# Patient Record
Sex: Female | Born: 2000 | Race: White | Hispanic: No | Marital: Single | Smoking: Never smoker
Health system: Southern US, Community
[De-identification: ages and names within clinical notes are randomized; demographics above are authoritative.]

---
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
## 2019-08-16 ENCOUNTER — Encounter: Payer: Self-pay | Admitting: Emergency Medicine

## 2019-08-16 ENCOUNTER — Other Ambulatory Visit: Payer: Self-pay

## 2019-08-16 ENCOUNTER — Emergency Department
Admission: EM | Admit: 2019-08-16 | Discharge: 2019-08-16 | Disposition: A | Discharge status: Home / Self Care | Payer: Medicaid Other | Attending: Student in an Organized Health Care Education/Training Program | Admitting: Student in an Organized Health Care Education/Training Program

## 2019-08-16 DIAGNOSIS — R112 Nausea with vomiting, unspecified: Secondary | ICD-10-CM | POA: Diagnosis present

## 2019-08-16 DIAGNOSIS — R1030 Lower abdominal pain, unspecified: Secondary | ICD-10-CM | POA: Diagnosis not present

## 2019-08-16 DIAGNOSIS — Z79899 Other long term (current) drug therapy: Secondary | ICD-10-CM | POA: Insufficient documentation

## 2019-08-16 LAB — URINALYSIS, COMPLETE (UACMP) WITH MICROSCOPIC
Bilirubin Urine: NEGATIVE
Glucose, UA: NEGATIVE mg/dL
Ketones, ur: 5 mg/dL — AB
Nitrite: NEGATIVE
Protein, ur: 30 mg/dL — AB
Specific Gravity, Urine: 1.031 — ABNORMAL HIGH (ref 1.005–1.030)
pH: 5 (ref 5.0–8.0)

## 2019-08-16 LAB — COMPREHENSIVE METABOLIC PANEL
ALT: 17 U/L (ref 0–44)
AST: 20 U/L (ref 15–41)
Albumin: 4.2 g/dL (ref 3.5–5.0)
Alkaline Phosphatase: 55 U/L (ref 38–126)
Anion gap: 11 (ref 5–15)
BUN: 10 mg/dL (ref 6–20)
CO2: 23 mmol/L (ref 22–32)
Calcium: 9.5 mg/dL (ref 8.9–10.3)
Chloride: 103 mmol/L (ref 98–111)
Creatinine, Ser: 0.77 mg/dL (ref 0.44–1.00)
GFR calc Af Amer: >60 mL/min (ref >60)
GFR calc non Af Amer: >60 mL/min (ref >60)
Glucose, Bld: 87 mg/dL (ref 70–99)
Potassium: 4 mmol/L (ref 3.5–5.1)
Sodium: 137 mmol/L (ref 135–145)
Total Bilirubin: 0.8 mg/dL (ref 0.3–1.2)
Total Protein: 8.1 g/dL (ref 6.5–8.1)

## 2019-08-16 LAB — POCT PREGNANCY, URINE: Preg Test, Ur: NEGATIVE

## 2019-08-16 LAB — LIPASE, BLOOD: Lipase: 24 U/L (ref 11–51)

## 2019-08-16 LAB — CBC
HCT: 41.4 % (ref 36.0–46.0)
Hemoglobin: 13.9 g/dL (ref 12.0–15.0)
MCH: 30.5 pg (ref 26.0–34.0)
MCHC: 33.6 g/dL (ref 30.0–36.0)
MCV: 91 fL (ref 80.0–100.0)
Platelets: 327 10*3/uL (ref 150–400)
RBC: 4.55 MIL/uL (ref 3.87–5.11)
RDW: 13.2 % (ref 11.5–15.5)
WBC: 8.6 10*3/uL (ref 4.0–10.5)
nRBC: 0 % (ref 0.0–0.2)

## 2019-08-16 MED ORDER — FLUCONAZOLE 150 MG PO TABS: 150.0000 mg | ORAL_TABLET | Freq: Every day | ORAL | 0 refills | Status: DC

## 2019-08-16 MED ORDER — ONDANSETRON 4 MG PO TBDP: 4.0000 mg | ORAL_TABLET | Freq: Three times a day (TID) | ORAL | 0 refills | Status: AC | PRN

## 2019-08-16 MED ORDER — ONDANSETRON 4 MG PO TBDP: 4.0000 mg | ORAL_TABLET | Freq: Once | ORAL | Status: DC | PRN

## 2019-08-16 MED ORDER — CEPHALEXIN 500 MG PO CAPS: 500.0000 mg | ORAL_CAPSULE | Freq: Two times a day (BID) | ORAL | 0 refills | Status: AC

## 2019-08-16 MED ORDER — CEPHALEXIN 500 MG PO CAPS
500.0000 mg | ORAL_CAPSULE | Freq: Once | ORAL | Status: AC
  Administered 2019-08-16: 500 mg via ORAL
  Filled 2019-08-16: qty 1

## 2019-08-16 MED ORDER — ONDANSETRON 4 MG PO TBDP
4.0000 mg | ORAL_TABLET | Freq: Once | ORAL | Status: AC
Start: 2019-08-16 — End: 2019-08-16
  Administered 2019-08-16: 19:00:00 4 mg via ORAL
  Filled 2019-08-16: qty 1

## 2019-08-16 NOTE — Discharge Instructions (Addendum)
Please follow up with primary care for symptoms that are not improving over the next few days.  Return to the ER for symptoms that change or worsen if unable to schedule an appointment.  

## 2019-08-16 NOTE — ED Triage Notes (Signed)
Pt arrived via POV with c/o lower abdominal pain, N/V x 3 days, unable to keep food or liquids down.   Pt describes the pain as constant and aching.

## 2019-08-16 NOTE — ED Notes (Signed)
See triage note- Pt here with N/V x 3days and constant abdominal pain. Pt reports missing period that was due Wednesday.

## 2019-08-16 NOTE — ED Provider Notes (Addendum)
[facility] Emergency Department Provider Note ____________________________________________   First MD Initiated Contact with Patient 08/16/19 1819     (approximate)  I have reviewed the triage vital signs and the nursing notes.   HISTORY  Chief Complaint Emesis and Abdominal Pain  HPI Cassandra Robertson is a 19 y.o. female who presents to the emergency department for treatment and evaluation of nausea and vomiting x 3 days.  Pain is constant and aching.  She has been able to drink water and sweet tea but has not been able to tolerate any food for the past 3 days.  She also states that she is a few days late on her menstrual cycle.  She denies any back pain.      History reviewed. No pertinent past medical history.  There are no problems to display for this patient.   History reviewed. No pertinent surgical history.  Prior to Admission medications   Medication Sig Start Date End Date Taking? Authorizing Provider  cephALEXin (KEFLEX) 500 MG capsule Take 1 capsule (500 mg total) by mouth 2 (two) times daily for 7 days. 08/16/19 08/23/19  Kem Boroughs B, FNP  norethindrone-ethinyl estradiol (JUNEL FE 1/20) 1-20 MG-MCG tablet Take 1 tablet by mouth daily. 06/05/18   Copland, Helmut Muster B, PA-C  ondansetron (ZOFRAN-ODT) 4 MG disintegrating tablet Take 1 tablet (4 mg total) by mouth every 8 (eight) hours as needed for nausea or vomiting. 08/16/19   Kem Boroughs B, FNP    Allergies Patient has no known allergies.  No family history on file.  Social History Social History   Tobacco Use  . Smoking status: Never Smoker  . Smokeless tobacco: Never Used  Substance Use Topics  . Alcohol use: No  . Drug use: No    Review of Systems  Constitutional: No fever/chills Eyes: No visual changes. ENT: No sore throat. Cardiovascular: Denies chest pain. Respiratory: Denies shortness of breath. Gastrointestinal: Positive for abdominal pain.  Positive for nausea and  vomiting.  No diarrhea.  No constipation. Genitourinary: Negative for dysuria. Musculoskeletal: Negative for back pain. Skin: Negative for rash. Neurological: Negative for headaches, focal weakness or numbness.  ____________________________________________   PHYSICAL EXAM:  VITAL SIGNS: ED Triage Vitals  Enc Vitals Group     BP 08/16/19 1604 (!) 141/93     Pulse Rate 08/16/19 1604 (!) 106     Resp 08/16/19 1604 18     Temp 08/16/19 1604 98.9 F (37.2 C)     Temp Source 08/16/19 1604 Oral     SpO2 08/16/19 1604 99 %     Weight 08/16/19 1606 140 lb (63.5 kg)     Height 08/16/19 1606 5' 2.5" (1.588 m)     Head Circumference --      Peak Flow --      Pain Score 08/16/19 1607 8     Pain Loc --      Pain Edu? --      Excl. in GC? --     Constitutional: Alert and oriented. Well appearing and in no acute distress. Eyes: Conjunctivae are normal. Head: Atraumatic. Nose: No congestion/rhinnorhea. Mouth/Throat: Mucous membranes are moist.  Oropharynx non-erythematous. Neck: No stridor.   Hematological/Lymphatic/Immunilogical: No cervical lymphadenopathy. Cardiovascular: Normal rate, regular rhythm. Grossly normal heart sounds.  Good peripheral circulation. Respiratory: Normal respiratory effort.  No retractions. Gastrointestinal: Soft with suprapubic tenderness. No distention. No abdominal bruits. No CVA tenderness. Genitourinary:  Musculoskeletal: No lower extremity tenderness nor edema.  No joint effusions.  Neurologic:  Normal speech and language. No gross focal neurologic deficits are appreciated. No gait instability. Skin:  Skin is warm, dry and intact. No rash noted. Psychiatric: Mood and affect are normal. Speech and behavior are normal.  ____________________________________________   LABS (all labs ordered are listed, but only abnormal results are displayed)  Labs Reviewed  URINALYSIS, COMPLETE (UACMP) WITH MICROSCOPIC - Abnormal; Notable for the following components:        Result Value   Color, Urine AMBER (*)    APPearance CLOUDY (*)    Specific Gravity, Urine 1.031 (*)    Hgb urine dipstick LARGE (*)    Ketones, ur 5 (*)    Protein, ur 30 (*)    Leukocytes,Ua TRACE (*)    Bacteria, UA RARE (*)    All other components within normal limits  LIPASE, BLOOD  COMPREHENSIVE METABOLIC PANEL  CBC  POCT PREGNANCY, URINE  POC URINE PREG, ED   ____________________________________________  EKG  Not indicated ____________________________________________  RADIOLOGY  ED MD interpretation:    Not indicated.  Official radiology report(s): No results found.  ____________________________________________   PROCEDURES  Procedure(s) performed (including Critical Care):  Procedures  ____________________________________________   INITIAL IMPRESSION / ASSESSMENT AND PLAN   19 year old female presents to the emergency department for treatment and evaluation of nausea and vomiting with abdominal pain for the past 3 days.  No diarrhea.  No fever.  She is able to drink water without vomiting, but food has made her nauseated for the past 3 days.  She has not attempted to eat anything in the past several hours.  No known sick exposures.  DIFFERENTIAL DIAGNOSIS  Onset of menses, pregnancy, gastroenteritis, COVID-19  ED COURSE  No vomiting after being given a Zofran while awaiting ER room assignment.  Protocol lab studies including urinalysis and POC pregnancy test were completed.  Pregnancy test is negative.  Labs are all reassuring.  Urinalysis does show concern for either onset of menses since she is 3 days late or acute cystitis.  Because she is somewhat symptomatic, she will be treated with 5 days of Keflex and advised to follow-up with her primary care provider for symptoms that are not improving over the next few days. Her first dose of Keflex given here since pharmacies are closed. No vomiting  reported. ____________________________________________   FINAL CLINICAL IMPRESSION(S) / ED DIAGNOSES  Final diagnoses:  Lower abdominal pain     ED Discharge Orders         Ordered    cephALEXin (KEFLEX) 500 MG capsule  2 times daily     08/16/19 1915    ondansetron (ZOFRAN-ODT) 4 MG disintegrating tablet  Every 8 hours PRN     08/16/19 1915           KEITH CANCIO was evaluated in Emergency Department on 08/16/2019 for the symptoms described in the history of present illness. She was evaluated in the context of the global COVID-19 pandemic, which necessitated consideration that the patient might be at risk for infection with the SARS-CoV-2 virus that causes COVID-19. Institutional protocols and algorithms that pertain to the evaluation of patients at risk for COVID-19 are in a state of rapid change based on information released by regulatory bodies including the CDC and federal and state organizations. These policies and algorithms were followed during the patient's care in the ED.   Note:  This document was prepared using Dragon voice recognition software and may include unintentional dictation errors.  Chinita Pester, FNP 08/16/19 1918    Chinita Pester, FNP 08/16/19 1919    Willy Eddy, MD 08/16/19 [phone]

## 2019-12-30 ENCOUNTER — Encounter: Payer: Self-pay | Admitting: Emergency Medicine

## 2019-12-30 ENCOUNTER — Emergency Department
Admission: EM | Admit: 2019-12-30 | Discharge: 2019-12-30 | Disposition: A | Discharge status: Home / Self Care | Payer: Medicaid Other | Attending: Emergency Medicine | Admitting: Emergency Medicine

## 2019-12-30 ENCOUNTER — Emergency Department: Payer: Medicaid Other

## 2019-12-30 DIAGNOSIS — N3090 Cystitis, unspecified without hematuria: Secondary | ICD-10-CM

## 2019-12-30 DIAGNOSIS — R319 Hematuria, unspecified: Secondary | ICD-10-CM | POA: Diagnosis present

## 2019-12-30 DIAGNOSIS — M791 Myalgia, unspecified site: Secondary | ICD-10-CM | POA: Diagnosis not present

## 2019-12-30 LAB — CBC
HCT: 38.7 % (ref 36.0–46.0)
Hemoglobin: 13.1 g/dL (ref 12.0–15.0)
MCH: 30.3 pg (ref 26.0–34.0)
MCHC: 33.9 g/dL (ref 30.0–36.0)
MCV: 89.6 fL (ref 80.0–100.0)
Platelets: 323 10*3/uL (ref 150–400)
RBC: 4.32 MIL/uL (ref 3.87–5.11)
RDW: 13.1 % (ref 11.5–15.5)
WBC: 11.8 10*3/uL — ABNORMAL HIGH (ref 4.0–10.5)
nRBC: 0 % (ref 0.0–0.2)

## 2019-12-30 LAB — COMPREHENSIVE METABOLIC PANEL
ALT: 19 U/L (ref 0–44)
AST: 19 U/L (ref 15–41)
Albumin: 3.9 g/dL (ref 3.5–5.0)
Alkaline Phosphatase: 65 U/L (ref 38–126)
Anion gap: 10 (ref 5–15)
BUN: 10 mg/dL (ref 6–20)
CO2: 24 mmol/L (ref 22–32)
Calcium: 8.9 mg/dL (ref 8.9–10.3)
Chloride: 103 mmol/L (ref 98–111)
Creatinine, Ser: 0.86 mg/dL (ref 0.44–1.00)
GFR calc Af Amer: >60 mL/min (ref >60)
GFR calc non Af Amer: >60 mL/min (ref >60)
Glucose, Bld: 93 mg/dL (ref 70–99)
Potassium: 3.6 mmol/L (ref 3.5–5.1)
Sodium: 137 mmol/L (ref 135–145)
Total Bilirubin: 0.5 mg/dL (ref 0.3–1.2)
Total Protein: 7.4 g/dL (ref 6.5–8.1)

## 2019-12-30 LAB — URINALYSIS, COMPLETE (UACMP) WITH MICROSCOPIC
Bacteria, UA: NONE SEEN
RBC / HPF: >50 RBC/hpf — ABNORMAL HIGH (ref 0–5)
Specific Gravity, Urine: 1.023 (ref 1.005–1.030)
Squamous Epithelial / HPF: NONE SEEN (ref 0–5)
WBC, UA: >50 WBC/hpf — ABNORMAL HIGH (ref 0–5)

## 2019-12-30 LAB — LIPASE, BLOOD: Lipase: 27 U/L (ref 11–51)

## 2019-12-30 LAB — POCT PREGNANCY, URINE: Preg Test, Ur: NEGATIVE

## 2019-12-30 MED ORDER — IOHEXOL 300 MG/ML  SOLN
75.0000 mL | Freq: Once | INTRAMUSCULAR | Status: AC | PRN
  Administered 2019-12-30: 100 mL via INTRAVENOUS

## 2019-12-30 MED ORDER — FLUCONAZOLE 150 MG PO TABS: 150.0000 mg | ORAL_TABLET | Freq: Every day | ORAL | 0 refills | Status: DC

## 2019-12-30 MED ORDER — SODIUM CHLORIDE 0.9% FLUSH: 3.0000 mL | Freq: Once | INTRAVENOUS | Status: DC

## 2019-12-30 MED ORDER — ACETAMINOPHEN 500 MG PO TABS
1000.0000 mg | ORAL_TABLET | Freq: Once | ORAL | Status: AC
  Administered 2019-12-30: 1000 mg via ORAL
  Filled 2019-12-30: qty 2

## 2019-12-30 MED ORDER — SODIUM CHLORIDE 0.9 % IV SOLN
1.0000 g | Freq: Once | INTRAVENOUS | Status: AC
  Administered 2019-12-30: 1 g via INTRAVENOUS
  Filled 2019-12-30: qty 10

## 2019-12-30 MED ORDER — CEPHALEXIN 500 MG PO CAPS: 500.0000 mg | ORAL_CAPSULE | Freq: Four times a day (QID) | ORAL | 0 refills | Status: AC

## 2019-12-30 NOTE — ED Notes (Signed)
Pt returned from CT via wheelchair.

## 2019-12-30 NOTE — ED Triage Notes (Signed)
Pt c/o urinary frequency, dysuria x3 days, hematuria starting this AM.

## 2019-12-30 NOTE — ED Provider Notes (Signed)
[facility] Emergency Department Provider Note  ____________________________________________   First MD Initiated Contact with Patient 12/30/19 [phone]     (approximate)  I have reviewed the triage vital signs and the nursing notes.   HISTORY  Chief Complaint Hematuria   HPI Cassandra Robertson is a 19 y.o. female with a past medical history of frequent recurrent urinary tract infections who presents for assessment of approximately 3 days of worsening dysuria, urinary frequency, suprapubic discomfort, and 1 day of gross hematuria.  Patient also endorses some left lower back pain.  She denies any fevers, chills, cough, shortness of breath, chest pain abdominal pain, nausea, vomiting, diarrhea, vaginal discharge, vaginal bleeding, rash, or other acute complaints.  States she took an over-the-counter antibiotic from [facility] 2 days ago but this has not helped.  She has never had bleeding with these episodes.  No clear alleviating or aggravating factors.  No prior similar episodes associate with bleeding.         History reviewed. No pertinent past medical history.  There are no problems to display for this patient.   History reviewed. No pertinent surgical history.  Prior to Admission medications   Medication Sig Start Date End Date Taking? Authorizing Provider  cephALEXin (KEFLEX) 500 MG capsule Take 1 capsule (500 mg total) by mouth 4 (four) times daily for 7 days. 12/30/19 01/06/20  Gilles Chiquito, MD  fluconazole (DIFLUCAN) 150 MG tablet Take 1 tablet (150 mg total) by mouth daily. 12/30/19   Gilles Chiquito, MD  norethindrone-ethinyl estradiol (JUNEL FE 1/20) 1-20 MG-MCG tablet Take 1 tablet by mouth daily. 06/05/18   Copland, Helmut Muster B, PA-C  ondansetron (ZOFRAN-ODT) 4 MG disintegrating tablet Take 1 tablet (4 mg total) by mouth every 8 (eight) hours as needed for nausea or vomiting. 08/16/19   Kem Boroughs B, FNP    Allergies Patient has no known  allergies.  No family history on file.  Social History Social History   Tobacco Use  . Smoking status: Never Smoker  . Smokeless tobacco: Never Used  Vaping Use  . Vaping Use: Former  Substance Use Topics  . Alcohol use: No  . Drug use: No    Review of Systems  Review of Systems  Constitutional: Negative for chills and fever.  HENT: Negative for sore throat.   Eyes: Negative for pain.  Respiratory: Negative for cough and stridor.   Cardiovascular: Negative for chest pain.  Gastrointestinal: Negative for vomiting.  Genitourinary: Positive for dysuria, frequency and hematuria.  Musculoskeletal: Positive for myalgias ( Left lower back ).  Skin: Negative for rash.  Neurological: Negative for seizures, loss of consciousness and headaches.  Psychiatric/Behavioral: Negative for suicidal ideas.  All other systems reviewed and are negative.     ____________________________________________   PHYSICAL EXAM:  VITAL SIGNS: ED Triage Vitals [12/30/19 0516]  Enc Vitals Group     BP 104/65     Pulse Rate 90     Resp 18     Temp 98.2 F (36.8 C)     Temp Source Oral     SpO2 98 %     Weight      Height      Head Circumference      Peak Flow      Pain Score      Pain Loc      Pain Edu?      Excl. in GC?    Vitals:   12/30/19 0516 12/30/19 0903  BP:  104/65 (!) 80/69  Pulse: 90 84  Resp: 18 16  Temp: 98.2 F (36.8 C)   SpO2: 98% 100%   Physical Exam Vitals and nursing note reviewed.  Constitutional:      General: She is not in acute distress.    Appearance: She is well-developed.  HENT:     Head: Normocephalic and atraumatic.     Right Ear: External ear normal.     Left Ear: External ear normal.     Nose: Nose normal.  Eyes:     Conjunctiva/sclera: Conjunctivae normal.  Cardiovascular:     Rate and Rhythm: Normal rate and regular rhythm.     Pulses: Normal pulses.     Heart sounds: No murmur heard.   Pulmonary:     Effort: Pulmonary effort is normal.  No respiratory distress.     Breath sounds: Normal breath sounds.  Abdominal:     Palpations: Abdomen is soft.     Tenderness: There is no abdominal tenderness. There is right CVA tenderness and left CVA tenderness.  Musculoskeletal:        General: No deformity.     Cervical back: Neck supple.  Skin:    General: Skin is warm and dry.     Capillary Refill: Capillary refill takes less than 2 seconds.  Neurological:     Mental Status: She is alert and oriented to person, place, and time.  Psychiatric:        Mood and Affect: Mood normal.      ____________________________________________   LABS (all labs ordered are listed, but only abnormal results are displayed)  Labs Reviewed  CBC - Abnormal; Notable for the following components:      Result Value   WBC 11.8 (*)    All other components within normal limits  URINALYSIS, COMPLETE (UACMP) WITH MICROSCOPIC - Abnormal; Notable for the following components:   Color, Urine RED (*)    APPearance CLOUDY (*)    Glucose, UA   (*)    Value: TEST NOT REPORTED DUE TO COLOR INTERFERENCE OF URINE PIGMENT   Hgb urine dipstick   (*)    Value: TEST NOT REPORTED DUE TO COLOR INTERFERENCE OF URINE PIGMENT   Bilirubin Urine   (*)    Value: TEST NOT REPORTED DUE TO COLOR INTERFERENCE OF URINE PIGMENT   Ketones, ur   (*)    Value: TEST NOT REPORTED DUE TO COLOR INTERFERENCE OF URINE PIGMENT   Protein, ur   (*)    Value: TEST NOT REPORTED DUE TO COLOR INTERFERENCE OF URINE PIGMENT   Nitrite   (*)    Value: TEST NOT REPORTED DUE TO COLOR INTERFERENCE OF URINE PIGMENT   Leukocytes,Ua   (*)    Value: TEST NOT REPORTED DUE TO COLOR INTERFERENCE OF URINE PIGMENT   RBC / HPF >50 (*)    WBC, UA >50 (*)    All other components within normal limits  URINE CULTURE  LIPASE, BLOOD  COMPREHENSIVE METABOLIC PANEL  POC URINE PREG, ED  POCT PREGNANCY, URINE    ____________________________________________  ____________________________________________  RADIOLOGY   Official radiology report(s): CT ABDOMEN PELVIS W CONTRAST  Result Date: 12/30/2019 CLINICAL DATA:  Hematuria with unknown cause. Back pain. Dysuria and frequency for 3 days. Concern for pyelonephritis. EXAM: CT ABDOMEN AND PELVIS WITH CONTRAST TECHNIQUE: Multidetector CT imaging of the abdomen and pelvis was performed using the standard protocol following bolus administration of intravenous contrast. CONTRAST:  OMNIPAQUE IOHEXOL 300 MG/ML  SOLN  COMPARISON:  10/27/2015 FINDINGS: Lower chest:  No contributory findings. Hepatobiliary: No focal liver abnormality.No evidence of biliary obstruction or stone. Pancreas: Unremarkable. Spleen: Unremarkable. Adrenals/Urinary Tract: Negative adrenals. No hydronephrosis or stone. Normal renal cortical enhancement. Prominent bladder wall thickness for the degree of distension. Stomach/Bowel:  No obstruction. No appendicitis. Vascular/Lymphatic: No acute vascular abnormality. No mass or adenopathy. Reproductive:No pathologic findings. Other: No ascites or pneumoperitoneum. Musculoskeletal: No acute abnormalities. IMPRESSION: Probable cystitis. No hydronephrosis, urinary calculus, or visible pyelonephritis. Electronically Signed   By: Marnee Spring M.D.   On: 12/30/2019 09:37    ____________________________________________   PROCEDURES  Procedure(s) performed (including Critical Care):  Procedures   ____________________________________________   INITIAL IMPRESSION / ASSESSMENT AND PLAN / ED COURSE        Overall patient's history, exam, and work-up is most consistent with hemorrhagic cystitis.  Patient is afebrile hemodynamically stable on arrival.  CT obtained to assess for evidence of pyelonephritis given CVA tenderness with elevated white blood cell count.  However there is no evidence of pyelonephritis on CT or other acute  intra-abdominal pathology including appendicitis, diverticulitis, kidney stone, or other process to explain patient's symptoms.  Her UA is read as to concentrated//dark to read however her symptoms are consistent with UTI and a urine culture was sent.  Patient given below noted antibiotics and Tylenol and discharged in stable condition with plan to take Keflex for 7 days and follow-up with her PCP.  She also requested 1 dose of Diflucan as she typically gets yeast infections when she is on antibiotics.  This was prescribed as well.  Discharged stable condition.  Strict return precautions advised and discussed.          ____________________________________________   FINAL CLINICAL IMPRESSION(S) / ED DIAGNOSES  Final diagnoses:  Cystitis  Hematuria, unspecified type     ED Discharge Orders         Ordered    cephALEXin (KEFLEX) 500 MG capsule  4 times daily     Discontinue  Reprint     12/30/19 1000    fluconazole (DIFLUCAN) 150 MG tablet  Daily     Discontinue  Reprint     12/30/19 1000           Note:  This document was prepared using Dragon voice recognition software and may include unintentional dictation errors.   Gilles Chiquito, MD 12/30/19 1002

## 2020-01-01 LAB — URINE CULTURE: Culture: 70000 — AB

## 2022-10-05 ENCOUNTER — Other Ambulatory Visit: Payer: Self-pay | Admitting: Family

## 2022-10-05 MED ORDER — HYDROXYZINE HCL 10 MG PO TABS: 10.0000 mg | ORAL_TABLET | Freq: Three times a day (TID) | ORAL | 0 refills | Status: AC | PRN

## 2022-10-05 MED ORDER — CEPHALEXIN 500 MG PO CAPS: 500.0000 mg | ORAL_CAPSULE | Freq: Two times a day (BID) | ORAL | 0 refills | Status: AC

## 2022-10-05 MED ORDER — FLUCONAZOLE 150 MG PO TABS: 150.0000 mg | ORAL_TABLET | ORAL | 0 refills | Status: AC

## 2023-11-23 ENCOUNTER — Encounter: Payer: Self-pay | Admitting: Family
